# Patient Record
Sex: Female | Born: 2005 | Race: White | Hispanic: Yes | Marital: Single | Smoking: Never smoker
Health system: Southern US, Community
[De-identification: ages and names within clinical notes are randomized; demographics above are authoritative.]

## PROBLEM LIST (undated history)

## (undated) DIAGNOSIS — R41840 Attention and concentration deficit: Secondary | ICD-10-CM

## (undated) DIAGNOSIS — Z889 Allergy status to unspecified drugs, medicaments and biological substances status: Secondary | ICD-10-CM

## (undated) DIAGNOSIS — Q998 Other specified chromosome abnormalities: Secondary | ICD-10-CM

## (undated) DIAGNOSIS — H6690 Otitis media, unspecified, unspecified ear: Secondary | ICD-10-CM

## (undated) DIAGNOSIS — F909 Attention-deficit hyperactivity disorder, unspecified type: Secondary | ICD-10-CM

## (undated) DIAGNOSIS — Q922 Partial trisomy: Secondary | ICD-10-CM

## (undated) DIAGNOSIS — J45909 Unspecified asthma, uncomplicated: Secondary | ICD-10-CM

## (undated) DIAGNOSIS — H902 Conductive hearing loss, unspecified: Secondary | ICD-10-CM

## (undated) HISTORY — DX: Partial trisomy: Q92.2

## (undated) HISTORY — DX: Allergy status to unspecified drugs, medicaments and biological substances: Z88.9

## (undated) HISTORY — DX: Otitis media, unspecified, unspecified ear: H66.90

## (undated) HISTORY — DX: Conductive hearing loss, unspecified: H90.2

## (undated) HISTORY — DX: Other specified chromosome abnormalities: Q99.8

## (undated) HISTORY — PX: MYRINGOTOMY: SHX2060

---
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
## 2023-01-01 ENCOUNTER — Encounter: Payer: Self-pay | Admitting: Pediatrics

## 2023-01-01 DIAGNOSIS — F900 Attention-deficit hyperactivity disorder, predominantly inattentive type: Secondary | ICD-10-CM

## 2023-01-01 NOTE — Telephone Encounter (Signed)
Aunt-Roda Siegle is going to check with [facility] to see if they have the 5MG  in stock and let me know.

## 2023-01-04 MED ORDER — DEXMETHYLPHENIDATE HCL ER 5 MG PO CP24: 10.0000 mg | ORAL_CAPSULE | Freq: Every day | ORAL | 0 refills | Status: DC

## 2023-01-05 ENCOUNTER — Encounter: Payer: Self-pay | Admitting: *Deleted

## 2023-01-22 ENCOUNTER — Other Ambulatory Visit: Payer: Self-pay | Admitting: Pediatrics

## 2023-01-26 IMAGING — DX DG ABDOMEN 1V
2 series · 2 of 2 positions shown · non-contrast
Comparison: None.

CLINICAL DATA: Constipation

EXAM:
ABDOMEN - 1 VIEW

[abdomen kub (1 of 2)]
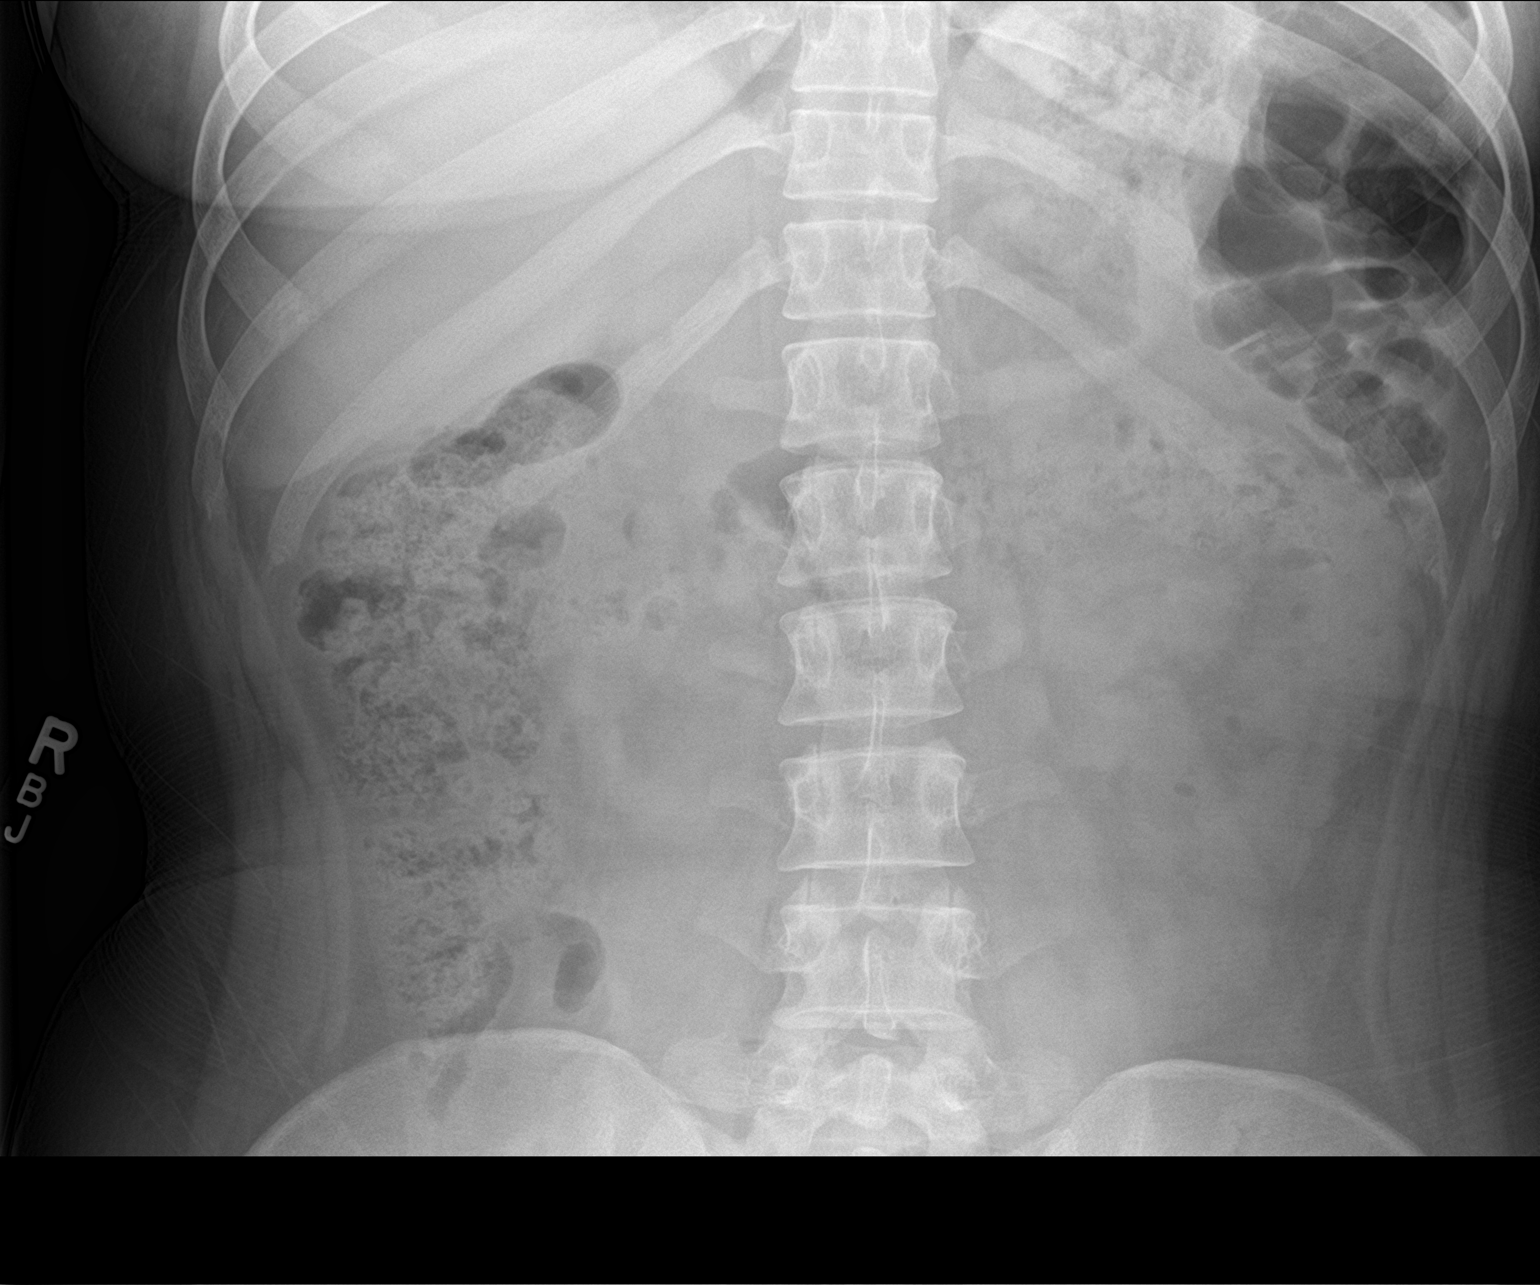

[abdomen kub (2 of 2)]
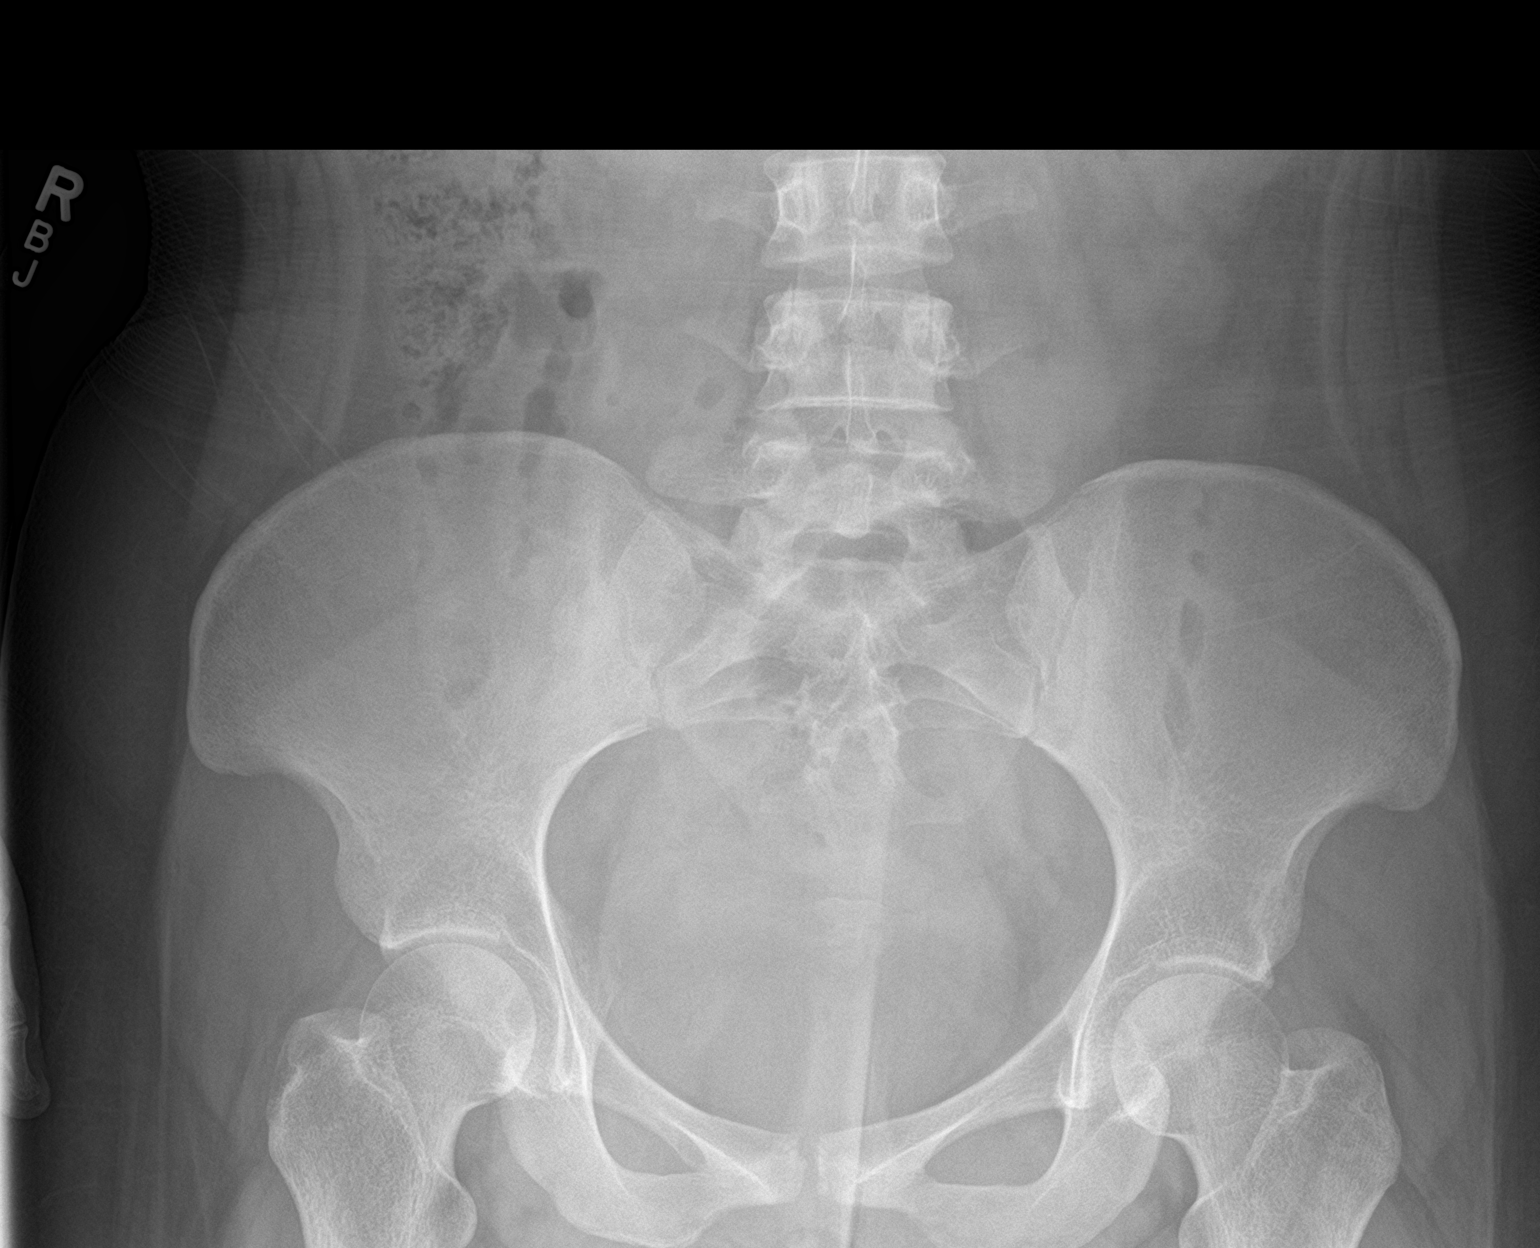

[2 of 2 positions shown; findings below may reference images not displayed]

FINDINGS: Bowel gas pattern is nonspecific. Moderate amount of stool is seen
in the ascending and transverse colon. There is no fecal impaction
in the rectosigmoid. No abnormal masses or calcifications are seen.
Kidneys are partly obscured by bowel contents.
IMPRESSION: Nonspecific bowel gas pattern. Moderate stool burden in the
ascending and transverse colon without signs of fecal impaction in
the rectosigmoid.

## 2023-02-05 ENCOUNTER — Other Ambulatory Visit: Payer: Self-pay | Admitting: Pediatrics

## 2023-02-05 DIAGNOSIS — F900 Attention-deficit hyperactivity disorder, predominantly inattentive type: Secondary | ICD-10-CM

## 2023-03-27 ENCOUNTER — Other Ambulatory Visit: Payer: Self-pay | Admitting: Pediatrics

## 2023-03-27 DIAGNOSIS — F900 Attention-deficit hyperactivity disorder, predominantly inattentive type: Secondary | ICD-10-CM

## 2023-03-27 MED ORDER — DEXMETHYLPHENIDATE HCL ER 10 MG PO CP24: 10.0000 mg | ORAL_CAPSULE | Freq: Every day | ORAL | 0 refills | Status: DC

## 2023-03-27 NOTE — Telephone Encounter (Signed)
Patient is requesting a Rx refill and they want it to be sent to [facility] in [place] on [address].

## 2023-05-30 ENCOUNTER — Other Ambulatory Visit: Payer: Self-pay | Admitting: Pediatrics

## 2023-05-30 DIAGNOSIS — N920 Excessive and frequent menstruation with regular cycle: Secondary | ICD-10-CM

## 2023-08-02 ENCOUNTER — Other Ambulatory Visit: Payer: Self-pay | Admitting: Pediatrics

## 2023-08-02 DIAGNOSIS — F900 Attention-deficit hyperactivity disorder, predominantly inattentive type: Secondary | ICD-10-CM

## 2023-08-02 MED ORDER — DEXMETHYLPHENIDATE HCL ER 10 MG PO CP24: 10.0000 mg | ORAL_CAPSULE | Freq: Every day | ORAL | 0 refills | Status: AC

## 2023-08-02 NOTE — Telephone Encounter (Signed)
Need refill 

## 2023-08-02 NOTE — Telephone Encounter (Signed)
Patient can't get into [facility] until February and she is wanting to see if she can get a refill on meds??

## 2023-08-17 ENCOUNTER — Other Ambulatory Visit: Payer: Self-pay | Admitting: Pediatrics

## 2023-08-17 DIAGNOSIS — J4531 Mild persistent asthma with (acute) exacerbation: Secondary | ICD-10-CM

## 2023-10-29 ENCOUNTER — Ambulatory Visit
Admission: RE | Admit: 2023-10-29 | Discharge: 2023-10-29 | Disposition: A | Discharge status: Home / Self Care | Payer: MEDICAID | Source: Ambulatory Visit

## 2023-10-29 VITALS — BP 103/79 | HR 95 | Temp 98.6°F | Resp 16

## 2023-10-29 DIAGNOSIS — L989 Disorder of the skin and subcutaneous tissue, unspecified: Secondary | ICD-10-CM

## 2023-10-29 DIAGNOSIS — K644 Residual hemorrhoidal skin tags: Secondary | ICD-10-CM

## 2023-10-29 MED ORDER — MUPIROCIN 2 % EX OINT: 1.0000 | TOPICAL_OINTMENT | Freq: Two times a day (BID) | CUTANEOUS | 0 refills | Status: AC

## 2023-10-29 MED ORDER — HYDROCORTISONE (PERIANAL) 2.5 % EX CREA: 1.0000 | TOPICAL_CREAM | Freq: Two times a day (BID) | CUTANEOUS | 0 refills | Status: AC | PRN

## 2023-10-29 MED ORDER — CHLORHEXIDINE GLUCONATE 4 % EX SOLN: Freq: Every day | CUTANEOUS | 0 refills | Status: AC | PRN

## 2023-10-29 NOTE — ED Triage Notes (Signed)
 Pt states she is having pain above rectum when wiping x 3 days. Pt mom states it looked like a abscess.
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
## 2024-05-11 ENCOUNTER — Other Ambulatory Visit: Payer: Self-pay | Admitting: Pediatrics

## 2024-05-11 DIAGNOSIS — N920 Excessive and frequent menstruation with regular cycle: Secondary | ICD-10-CM
